# Patient Record
Sex: Female | Born: 1980 | Race: White | Hispanic: Yes | Marital: Married | Smoking: Current some day smoker
Health system: Southern US, Community
[De-identification: ages and names within clinical notes are randomized; demographics above are authoritative.]

---

## 2006-01-02 ENCOUNTER — Inpatient Hospital Stay (HOSPITAL_COMMUNITY): Admission: AD | Admit: 2006-01-02 | Discharge: 2006-01-02 | Payer: Self-pay | Admitting: Obstetrics and Gynecology

## 2006-02-08 ENCOUNTER — Inpatient Hospital Stay (HOSPITAL_COMMUNITY): Admission: AD | Admit: 2006-02-08 | Discharge: 2006-02-08 | Payer: Self-pay | Admitting: Obstetrics and Gynecology

## 2006-04-04 ENCOUNTER — Ambulatory Visit (HOSPITAL_COMMUNITY): Admission: RE | Admit: 2006-04-04 | Discharge: 2006-04-04 | Payer: Self-pay | Admitting: Gynecology

## 2006-05-02 ENCOUNTER — Ambulatory Visit (HOSPITAL_COMMUNITY): Admission: RE | Admit: 2006-05-02 | Discharge: 2006-05-02 | Payer: Self-pay | Admitting: Gynecology

## 2006-09-03 ENCOUNTER — Inpatient Hospital Stay: Admission: AD | Admit: 2006-09-03 | Discharge: 2006-09-03 | Payer: Self-pay | Admitting: Family Medicine

## 2006-09-03 ENCOUNTER — Inpatient Hospital Stay (HOSPITAL_COMMUNITY): Admission: AD | Admit: 2006-09-03 | Discharge: 2006-09-04 | Payer: Self-pay | Admitting: Obstetrics & Gynecology

## 2006-09-03 ENCOUNTER — Ambulatory Visit: Payer: Self-pay | Admitting: Obstetrics & Gynecology

## 2006-09-03 ENCOUNTER — Ambulatory Visit: Payer: Self-pay | Admitting: Family Medicine

## 2006-09-04 ENCOUNTER — Inpatient Hospital Stay (HOSPITAL_COMMUNITY): Admission: AD | Admit: 2006-09-04 | Discharge: 2006-09-06 | Payer: Self-pay | Admitting: Obstetrics & Gynecology

## 2006-09-04 ENCOUNTER — Ambulatory Visit: Payer: Self-pay | Admitting: *Deleted

## 2007-06-25 IMAGING — US US OB COMP LESS 14 WK
1 series · 14 of 28 positions shown · non-contrast
Comparison: None.

CLINICAL DATA: 4 weeks pregnant.  Vaginal discharge.  Lower abdominal pain.  Beta HCG level pending.
 OBSTETRICAL ULTRASOUND <14 WKS AND TRANSVAGINAL OB ULTRASOUND ? 01/02/06:
TECHNIQUE: Both transabdominal and transvaginal ultrasound examinations were performed for complete evaluation of the gestation as well as the maternal uterus, adnexal regions, and pelvic cul-de-sac.

[Series 1: us ob comp less 14 wk · 0.29mm/px · 14 of 35 slices shown]
[im 2/35]
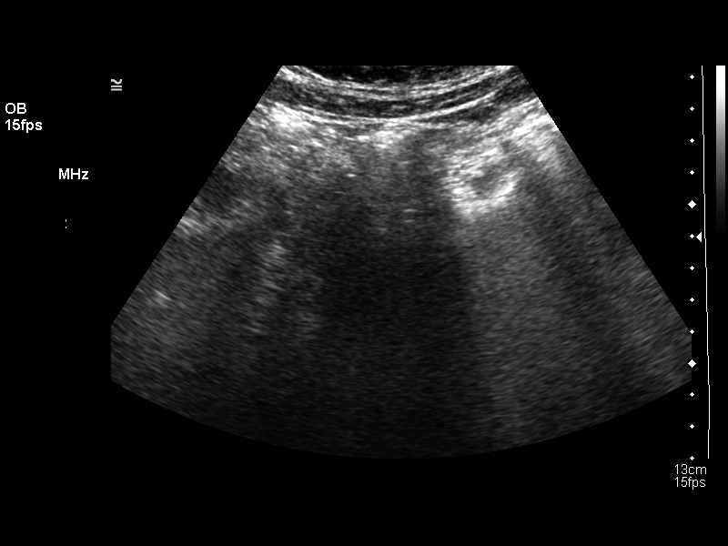
[im 4/35]
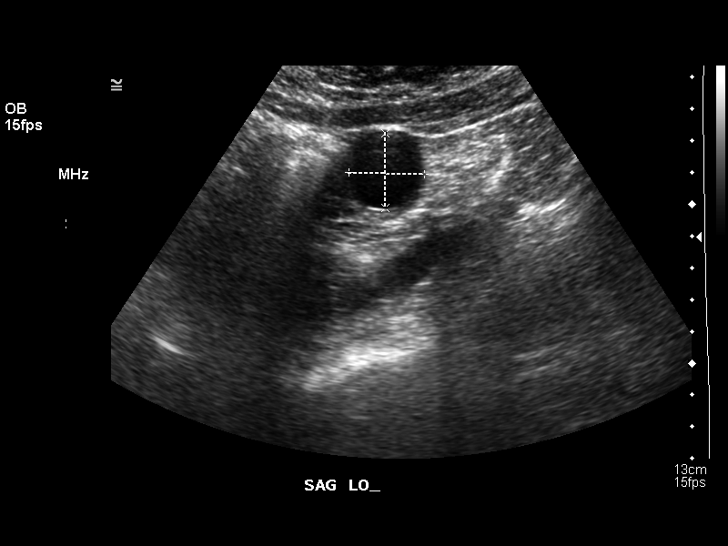
[im 7/35]
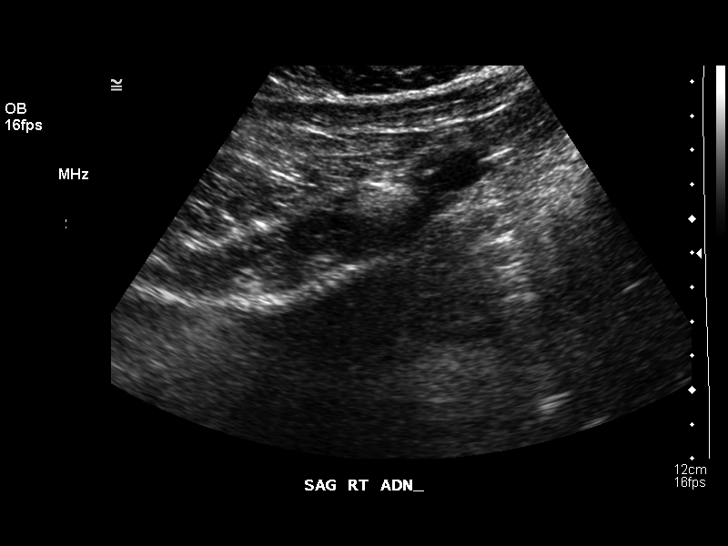
[im 9/35]
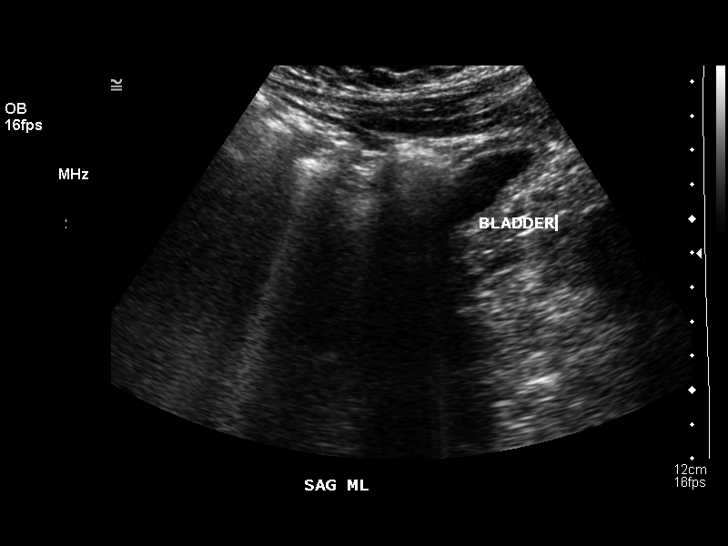
[im 12/35]
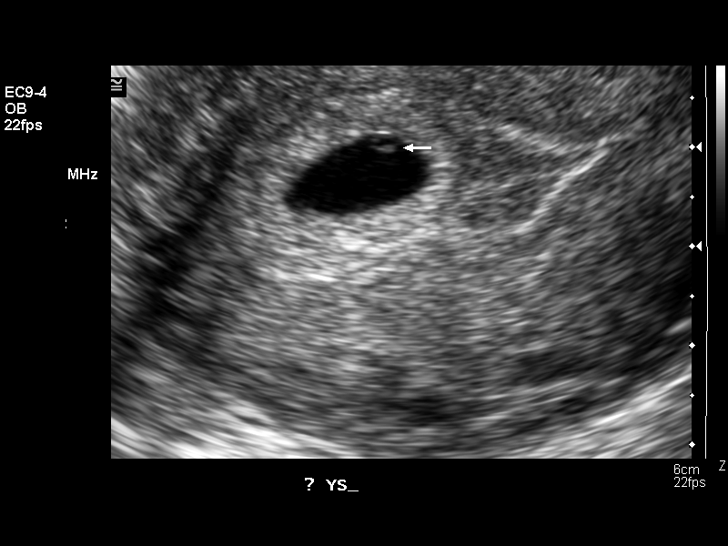
[im 14/35]
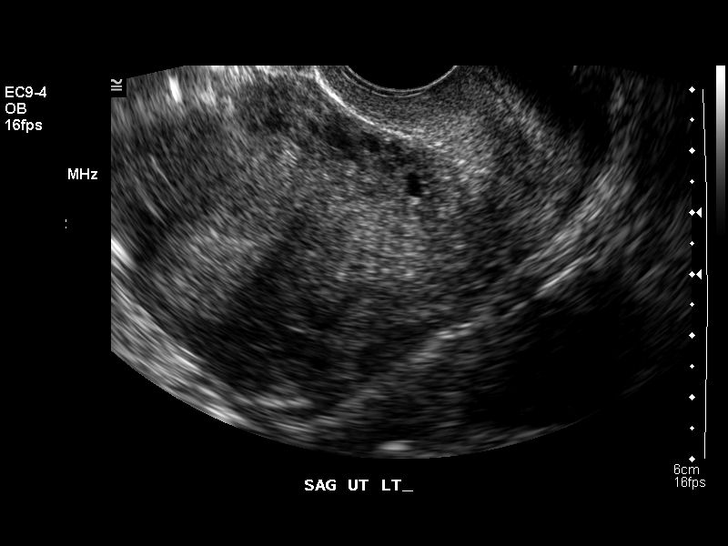
[im 17/35]
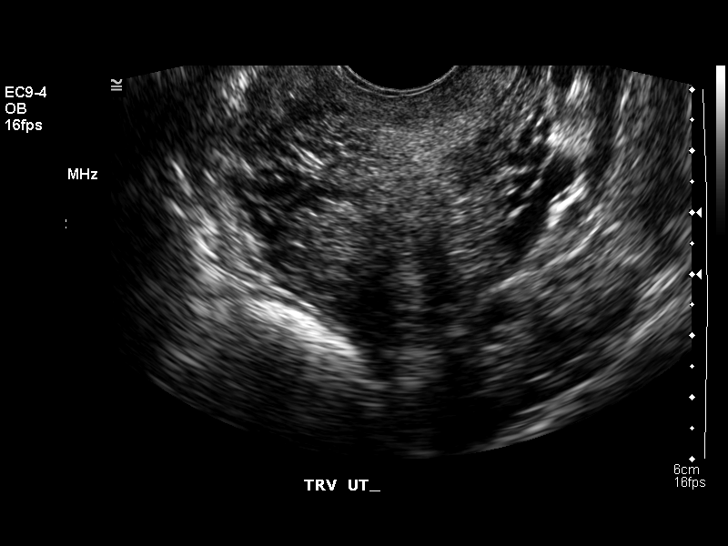
[im 19/35]
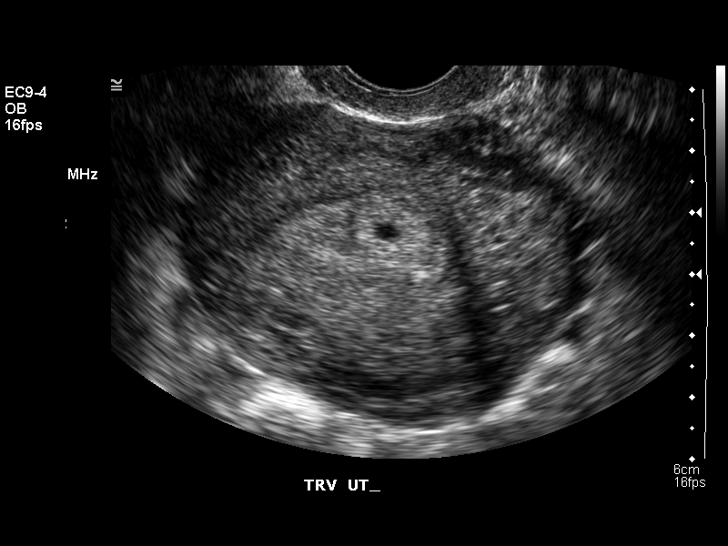
[im 22/35]
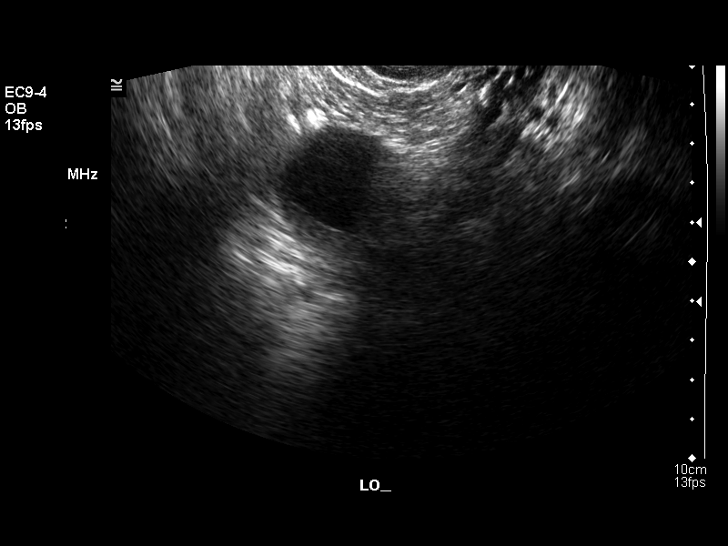
[im 24/35]
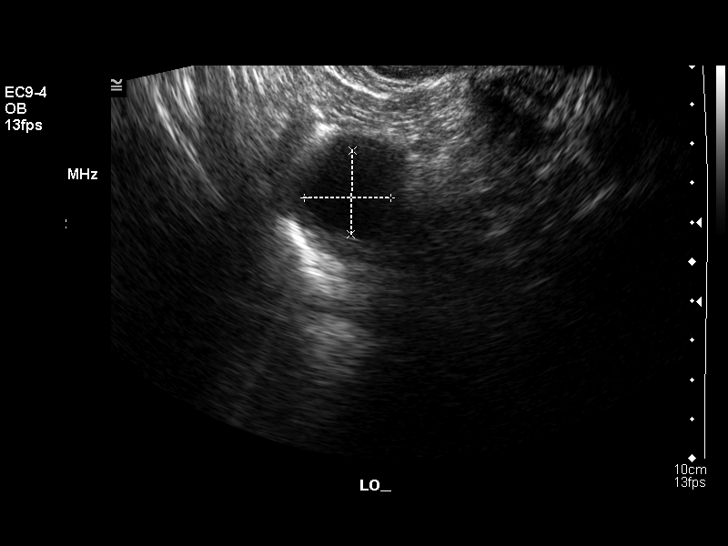
[im 27/35]
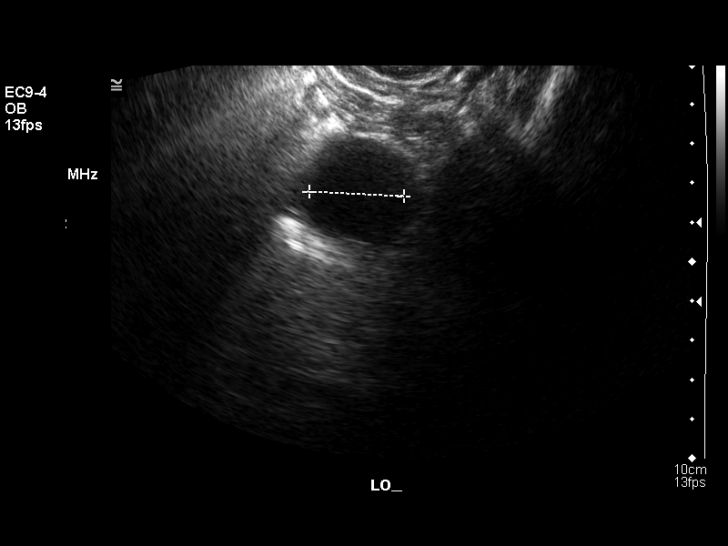
[im 29/35]
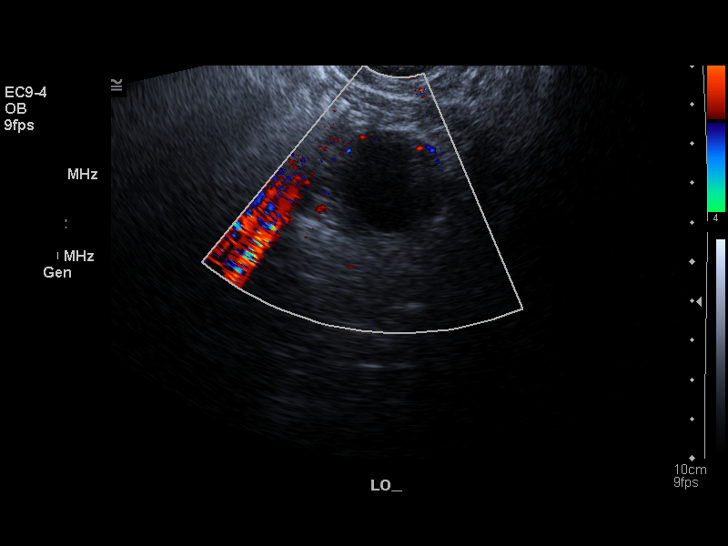
[im 32/35]
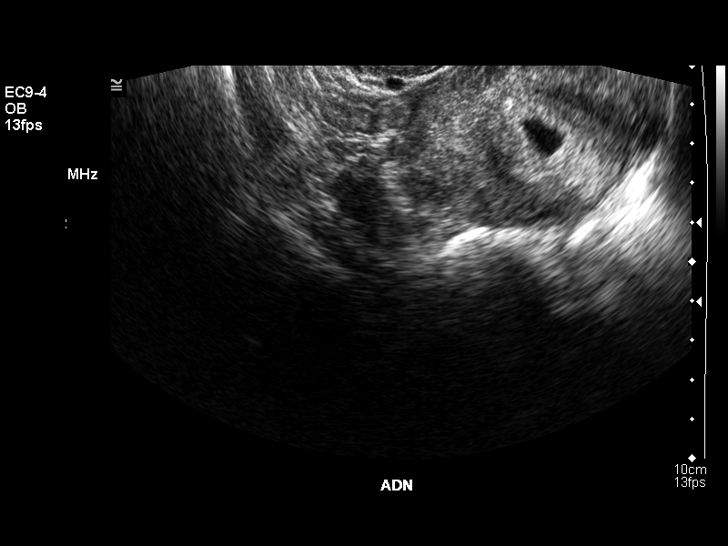
[im 35/35]
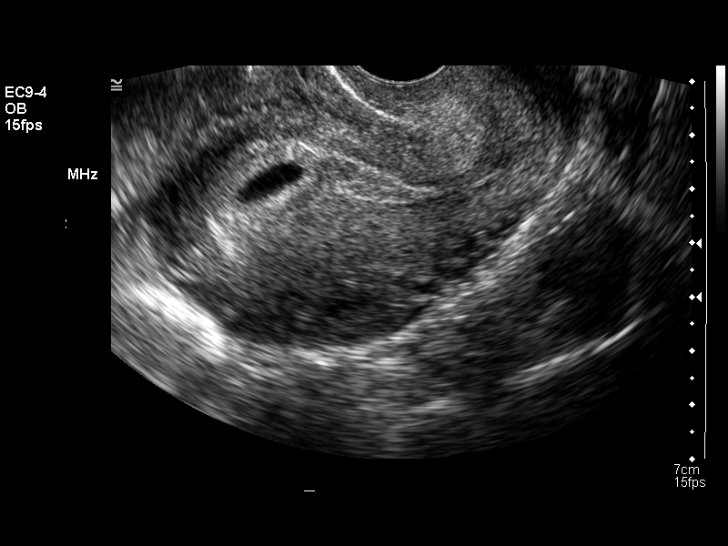

[14 of 28 positions shown; findings below may reference images not displayed]

FINDINGS: A single gestational sac is identified with a mean diameter of 10 mm corresponding to 5 weeks 5 days.  A yolk sac is identified.  An embryo is not identified.  There is no evidence of subchorionic hemorrhage.
 The right ovary is not well visualized.  The left ovary measures 3.5 x 2.1 x 3.1 cm and demonstrates a 2.3 cm cystic lesion within.  
 There is no significant free fluid.
IMPRESSION: 1.  Gestational sac corresponding to a gestational age of 5 weeks 5 days.  Small yolk sac but no fetal pole identified.  There is no evidence of subchorionic hemorrhage.  
 2.  Probable left ovarian corpus luteal cyst.

## 2007-09-25 IMAGING — US US OB COMP +14 WK
1 series · 13 of 28 positions shown · non-contrast
Comparison: none

CLINICAL DATA: Anatomic evaluation.

[Series 1: us ob comp +14 wk · 0.18mm/px · 13 of 128 slices shown]
[im 5/128]
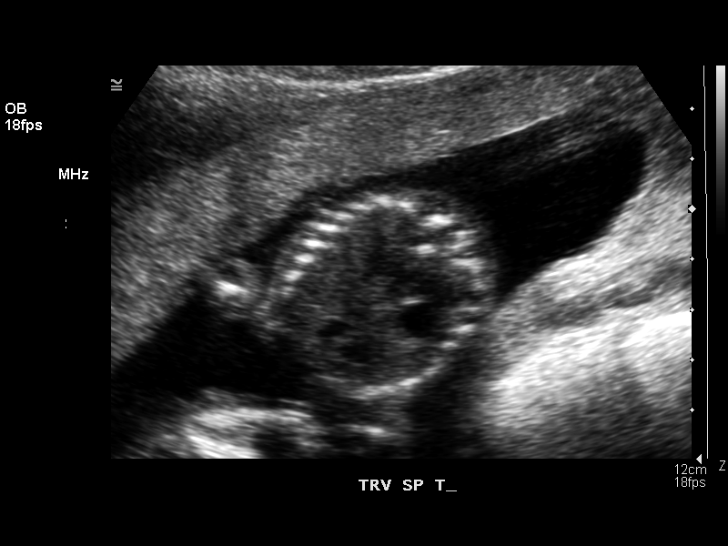
[im 15/128]
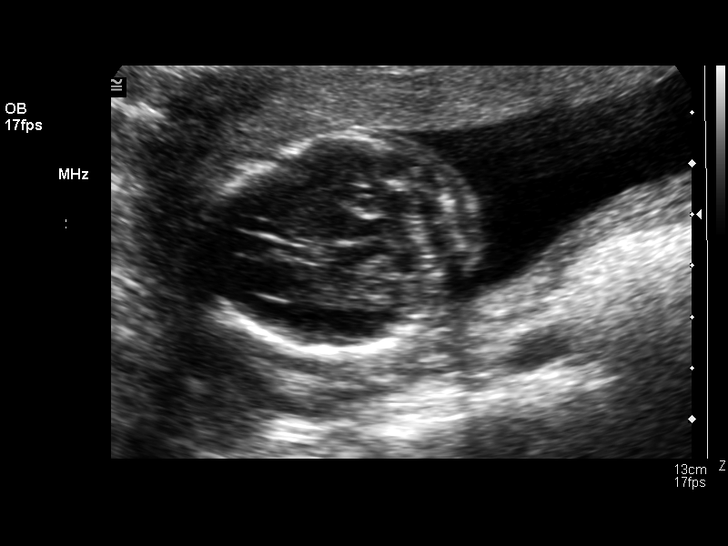
[im 24/128]
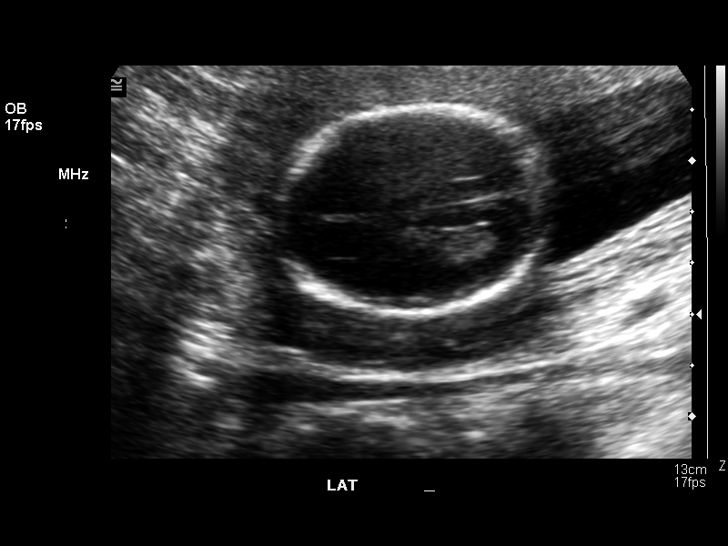
[im 33/128]
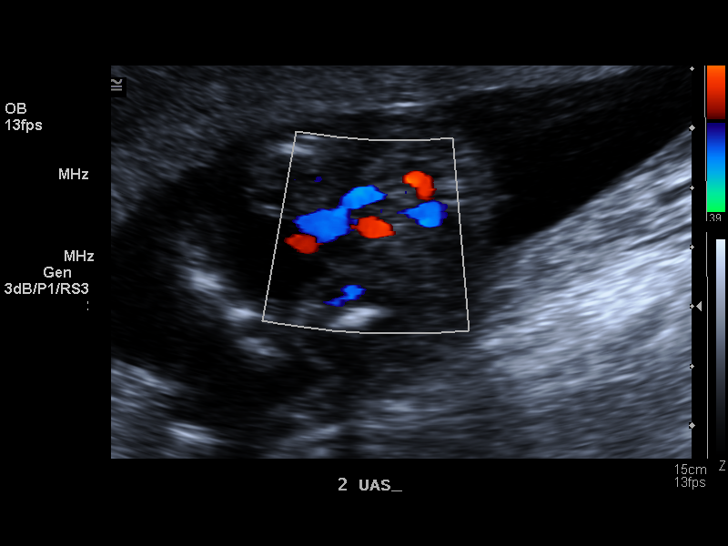
[im 43/128]
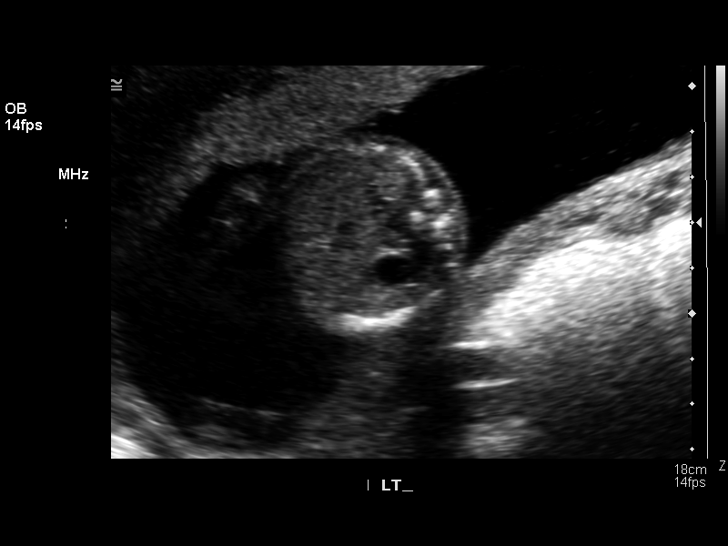
[im 52/128]
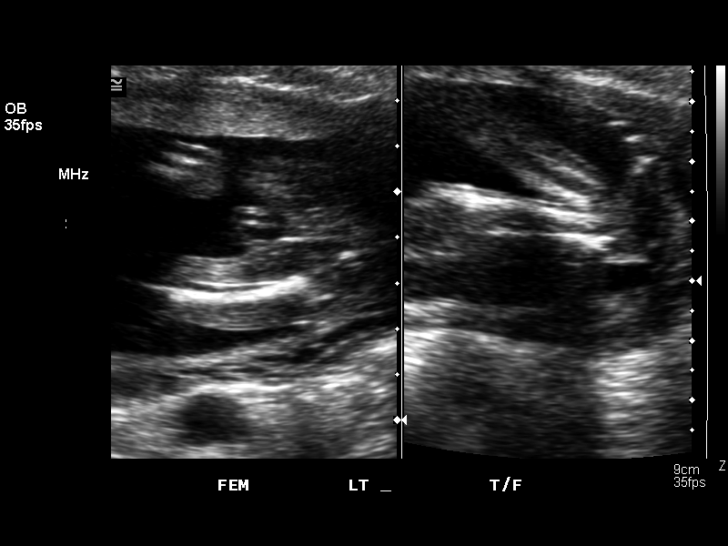
[im 66/128]
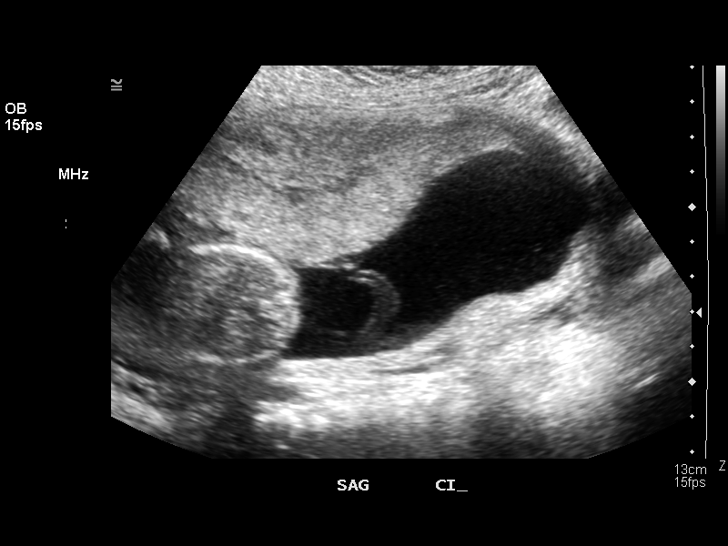
[im 76/128]
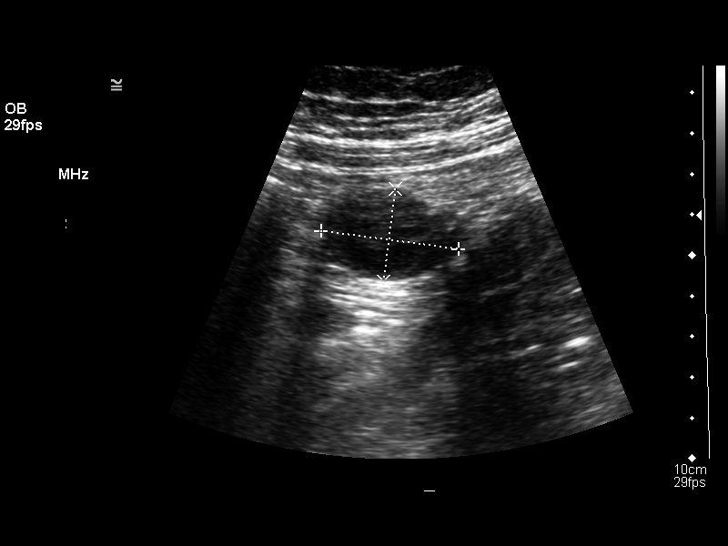
[im 85/128]
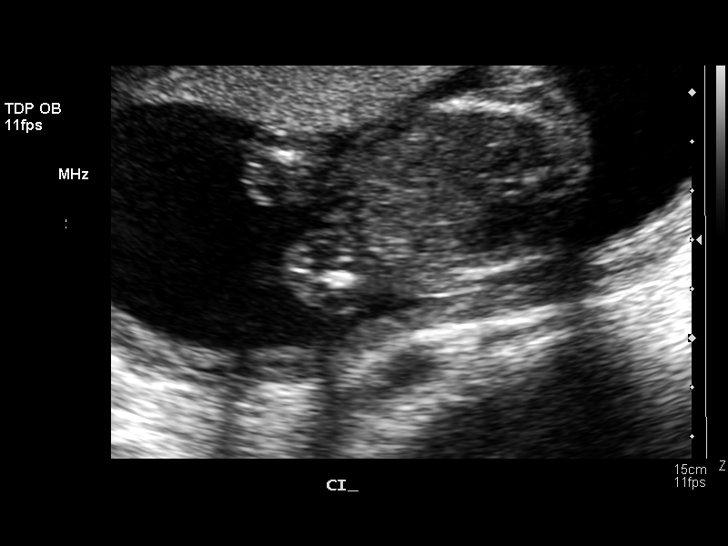
[im 95/128]
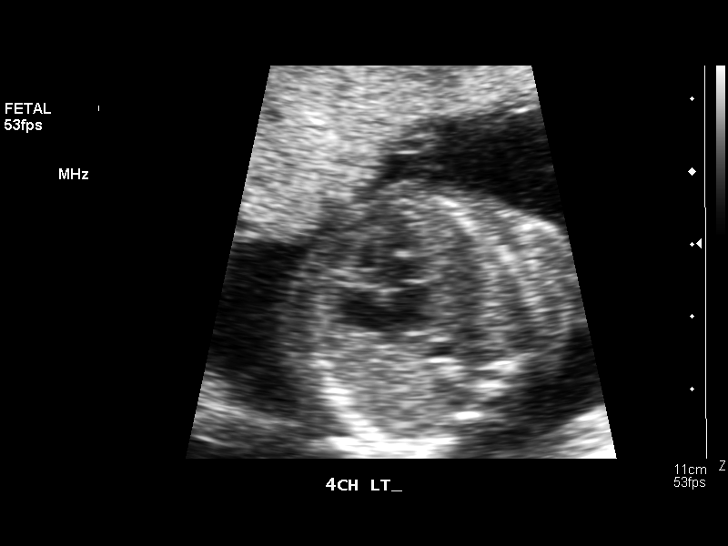
[im 104/128]
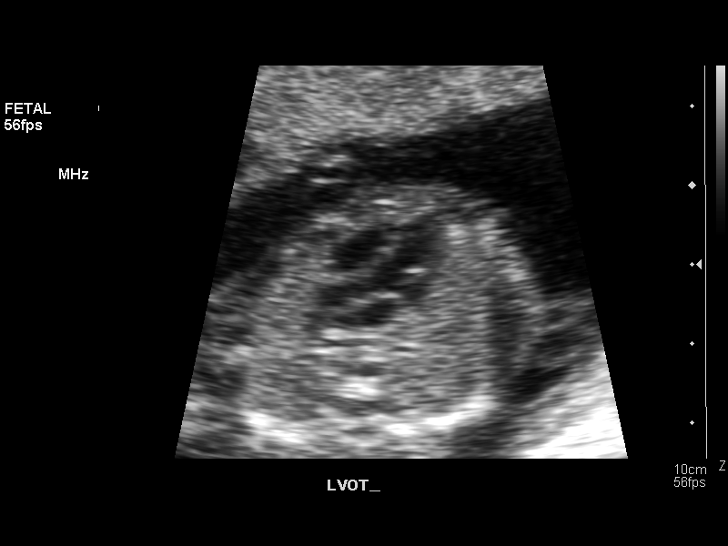
[im 113/128]
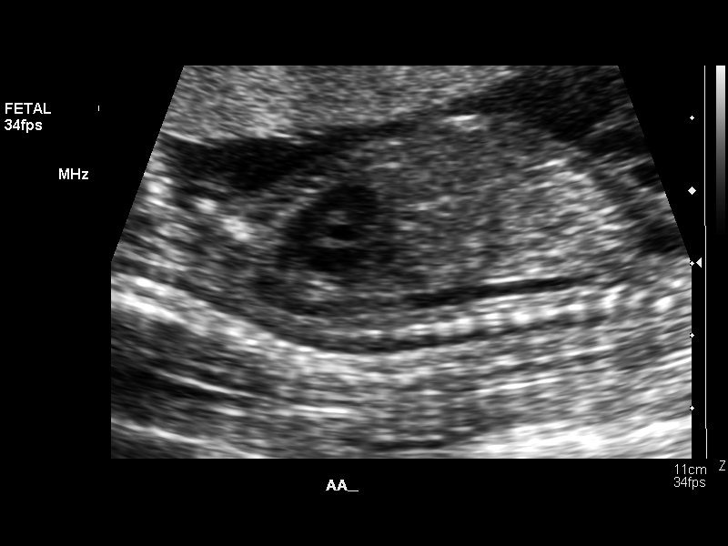
[im 123/128]
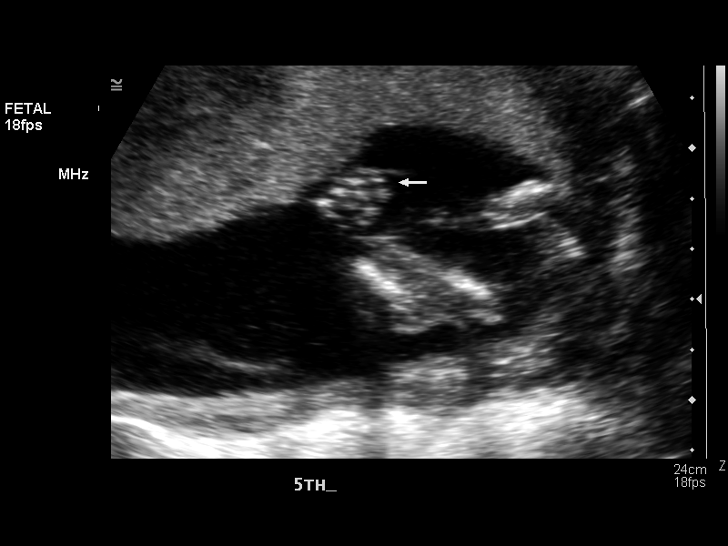

[13 of 28 positions shown; findings below may reference images not displayed]

OBSTETRICAL ULTRASOUND:
Number of Fetuses:  1
Heart Rate:  139 bpm
Movement:  Yes
Breathing:  No  
Presentation:  Transverse
Placental Location:  Anterior
Grade:  I
Previa:  No
Amniotic Fluid (Subjective):  Normal
Amniotic Fluid (Objective):   5.5 cm vertical pocket 

FETAL BIOMETRY
BPD:  4.1 cm   18 w 3 d 
HC:  15.4 cm   18 w 2 d 
AC:  13.5 cm   19 w 0 d 
FL:  3.0 cm   19 w 1 d
HL:  2.8 cm   19 w 1 d 

MEAN GA:  18 w 5 d   US EDC:  08/31/06

FETAL ANATOMY
Lateral Ventricles:  Visualized 
Thalami/CSP:  Visualized 
Posterior Fossa:  Visualized 
Nuchal Region:  NF= 3.8 mm  Visualized 
Spine:  Visualized 
4 Chamber Heart on Left:  Visualized 
Stomach on Left:  Visualized 
3 Vessel Cord:  Visualized 
Cord Insertion site:  Visualized 
Kidneys:  Visualized 
Bladder:  Visualized 
Extremities:  Visualized 

ADDITIONAL ANATOMY VISUALIZED:  LVOT, RVOT, upper lip, orbits, profile, diaphragm, heel, 5th digit, ductal arch, and aortic arch.

Noted is the presence of an echogenic intracardiac focus.

MATERNAL UTERINE AND ADNEXAL FINDINGS
Cervix:   3.6 cm transabdominal.

Noted is a simple left ovarian cyst measuring 2.2 x 2.2 x 2.2 cm.
IMPRESSION: 1.  Single intrauterine pregnancy demonstrating an estimated gestational age by ultrasound of 18 weeks 5 days.  Correlation with expected estimated gestational age by LMP of 18 weeks 6 days correlates with appropriate growth.  
2.  Noted is the presence of an isolated echogenic intracardiac focus within the left ventricle.  The remaining fetal anatomy was well visualized and has a normal appearance.  Specifically, no other soft markers for Down syndrome were noted.  Given the patient?s pre ultrasound odds-risk ratio for Down syndrome based on age alone of 1 in 928, today?s isolated finding would increase the patient?s odds-risk ratio for Down syndrome to [DATE].  Correlation with triple screen results would be recommended for a more complete assessment.  
3.  Simple left ovarian cyst is identified.  Given the current gestational age, this is felt unlikely to represent a corpus luteum cyst and most likely represents a persistent functional or benign neoplastic process.  Follow-up evaluation has been scheduled at the patient?s convenience in 1 month for short-term reassessment.  The patient?s scheduled appointment date is 05/02/06.

## 2016-05-05 LAB — GLUCOSE, POCT (MANUAL RESULT ENTRY): POC GLUCOSE: 78 mg/dL (ref 70–99)

## 2020-02-05 ENCOUNTER — Other Ambulatory Visit: Payer: Self-pay | Admitting: Obstetrics and Gynecology

## 2020-02-05 DIAGNOSIS — Z1231 Encounter for screening mammogram for malignant neoplasm of breast: Secondary | ICD-10-CM

## 2020-03-09 ENCOUNTER — Ambulatory Visit: Payer: Self-pay

## 2021-03-21 ENCOUNTER — Other Ambulatory Visit: Payer: Self-pay

## 2021-03-21 DIAGNOSIS — Z1231 Encounter for screening mammogram for malignant neoplasm of breast: Secondary | ICD-10-CM

## 2021-05-03 ENCOUNTER — Ambulatory Visit: Payer: Self-pay

## 2022-10-17 ENCOUNTER — Other Ambulatory Visit: Payer: Self-pay | Admitting: Obstetrics and Gynecology

## 2022-10-17 DIAGNOSIS — Z1231 Encounter for screening mammogram for malignant neoplasm of breast: Secondary | ICD-10-CM

## 2022-11-16 ENCOUNTER — Ambulatory Visit: Payer: Self-pay | Admitting: Hematology and Oncology

## 2022-11-16 ENCOUNTER — Ambulatory Visit
Admission: RE | Admit: 2022-11-16 | Discharge: 2022-11-16 | Disposition: A | Discharge status: Home / Self Care | Payer: No Typology Code available for payment source | Source: Ambulatory Visit | Attending: Obstetrics and Gynecology | Admitting: Obstetrics and Gynecology

## 2022-11-16 ENCOUNTER — Other Ambulatory Visit: Payer: Self-pay

## 2022-11-16 VITALS — BP 118/71 | Ht 61.0 in | Wt 127.0 lb

## 2022-11-16 DIAGNOSIS — Z1231 Encounter for screening mammogram for malignant neoplasm of breast: Secondary | ICD-10-CM

## 2022-11-16 MED ORDER — FLUCONAZOLE 150 MG PO TABS
150.0000 mg | ORAL_TABLET | Freq: Every day | ORAL | 2 refills | Status: DC
  Filled 2022-11-16: qty 2, 2d supply, fill #0
  Filled 2022-12-26: qty 2, 2d supply, fill #1

## 2022-11-16 NOTE — Progress Notes (Signed)
[name] is a 42 y.o. female who presents to [facility] clinic today with complaint of vaginal discharge not relieved by Metronidazole vaginal cream. Has area on left breast with lump with tenderness around menstrual cycles. Will send prescription to pharmacy for Diflucan.      Pap Smear: Pap not smear completed today. Last Pap smear was 2021 at [facility] clinic and was normal per patient. Will obtain records from [facility]. Per patient has no history of an abnormal Pap smear. Last Pap smear result is not available in Epic.   Physical exam: Breasts Breasts symmetrical. No skin abnormalities bilateral breasts. No nipple retraction bilateral breasts. No nipple discharge bilateral breasts. No lymphadenopathy. Round, mobile, non-tender mass noted on left breast. No lumps palpated right breast.         Pelvic/Bimanual Pap is not indicated today    Smoking History: Patient has reports smoking occasionally.  Not referred to quit line.    Patient Navigation: Patient education provided. Access to services provided for patient through BCCCP program. Lisa Jarvis interpreter provided. No transportation provided   Colorectal Cancer Screening: Per patient has never had colonoscopy completed. Not indicated at this time due to age. No complaints today.    Breast and Cervical Cancer Risk Assessment: Patient has no immediate family history of breast cancer, known genetic mutations, or radiation treatment to the chest before age 39. Reports she has maternal 2nd cousin with history of breast cancer. Patient does not have history of cervical dysplasia, immunocompromised, or DES exposure in-utero.  Risk Scores as of 11/16/2022     Lisa Jarvis           5-year 0.78 %   Lifetime 9.81 %   This patient is [place]/Lat[place] but has no documented birth country, so the [place] model used data from [place] patients to calculate their risk score. Document a birth country in the Demographics activity  for a more accurate score.         Last calculated by Lisa Jarvis, CMA on 11/16/2022 at  1:59 PM        A: BCCCP exam without pap smear Complains of breast tenderness during menstrual cycle and lump on left breast.   P: Referred patient to [facility] for a screening mammogram. Appointment scheduled 11/16/2022 at 230 pm.  Lisa Catching, RN 11/16/2022 2:19 PM

## 2022-11-17 ENCOUNTER — Other Ambulatory Visit: Payer: Self-pay

## 2022-11-22 ENCOUNTER — Other Ambulatory Visit: Payer: Self-pay

## 2022-12-19 NOTE — Progress Notes (Deleted)
Wisewoman initial screening   Interpreter- Natale Lay, [employer]   Clinical Measurement: There were no vitals filed for this visit. Fasting Labs Drawn Today, will review with patient when they result.   Medical History: Patient states that she {Blank single:19197::"has","does not have","does not know if she has"} high cholesterol, {Blank single:19197::"has","does not have","does not know if she has"} high blood pressure and she {Blank single:19197::"has","does not have","does not know if she has"} diabetes.  Medications: Patient states that she {Blank single:19197::"does","does not"} take medication to lower cholesterol, blood pressure or blood sugar.  Patient {Blank single:19197::"does","does not"} take an aspirin a day to help prevent a heart attack or stroke. During the past 7 days patient {Blank single:19197::"has","has not","N/A"} taken prescribed medication to lower {Blank single:19197::"cholesterol","blood pressure","blood sugar"} on *** days.   Blood pressure, self measurement: Patient states that she {Blank single:19197::"does","does not","does not have the equipment to"} measure blood pressure from home. She checks her blood pressure {Blank single:19197::"multiple times per day","daily","a few times per week","weekly","monthly","N/A"}. She shares her readings with a health care provider: {Blank single:19197::"yes","no","N/A"}.   Nutrition: Patient states that on average she eats *** cups of fruit and *** cups of vegetables per day. Patient states that she {Blank single:19197::"does","does not"} eat fish at least 2 times per week. Patient eats {Blank single:19197::"less than half","about half","more than half"} servings of whole grains. Patient drinks less than 36 ounces of beverages with added sugar weekly: {Blank single:19197::"yes","no"}. Patient is currently watching sodium or salt intake: {Blank single:19197::"yes","no"}. In the past 7 days patient has consumed drinks containing alcohol on  *** days. On a day that patient consumes drinks containing alcohol on average *** drinks are consumed.      Physical activity: Patient states that she gets *** minutes of moderate and *** minutes of vigorous physical activity each week.  Smoking status: Patient states that she has {Blank single:19197::"has never smoked","is a former smoker, quit 1-12 months ago","is a former smoker, quit 12+ months ago","is a current smoker"} .   Quality of life: Over the past 2 weeks patient states that she had little interest or pleasure in doing things: {Blank single:19197::"not at all","several days","more than half","nearly everyday"}. She has been feeling down, depressed or hopeless:{Blank single:19197::"not at all","several days","more than half","nearly everyday"}.   Social Determinants of Health Assessment:   Computer Use: During the last 12 months patient states that she has used any of the following: desktop/laptop, smart phone or tablet/other portable wireless computer: {Blank single:19197::"yes","no","don't know"}.   Internet Use: During the last 12 months, did you or any member of your household have access to the internet: {Blank single:19197::"Yes, by paying a cell phone company or internet service provider","Yes, without paying a cell phone company or internet service provider","No access to internet in this house,apartment or mobile home"}.   Food Insecurities: During the last 12 months, where there any times when you were worried that you would run out of food because of a lack of money or other resources: {Blank single:19197::"Yes","No"}.   Transportation Barriers: During the last 12 months, have you missed a doctor's appointment because of transportation problems: {Blank single:19197::"Yes","No"}.   Childcare Barriers: If you are currently using childcare services, please identify  the type of services you use. (If not using childcare services, please select "Not applicable"): {Blank  single:19197::"infant (birth to 22 months","toddler (11 to 36 months)","preschool (3 to 5 years)","after school care (K-9th grade)","not applicable"}. During the last 12 months, have you had any barriers to childcare services such as: {Blank single:19197::"cost","availability","location","transportation","hours of operation","other","not applicable"}.  Housing: What is your housing situation today: {Blank single:19197::"I have housing","I have housing, but I am worried about losing my housing","I do not have housing"}.   Intimate Partner Violence: During the last 12 months, how often did your partner physically hurt you: {Blank single:19197::"never","rarely","sometimes","fairly often","frequently","don't want to answer"}. During the last 12 months, how often did your partner insult you or talk down to you: {Blank single:19197::"never","rarely","sometimes","fairly often","frequently","don't want to answer"}.  Medication Adherence: During the last 12 months, did you ever forget to take your medicine: {Blank single:19197::"Yes","No","not applicable"}. During the last 12 months, were you careless ar times about taking your medicine: {Blank single:19197::"Yes","No","not applicable"}. During the last 12 months, when you felt better did you sometimes stop taking your medication: {Blank single:19197::"Yes","No","not applicable"}. During the last 12 months, sometimes if you felt worse when you took your medicine did you stop taking it: {Blank single:19197::"Yes","No","not applicable"}.   Risk reduction and counseling:   Health Coaching:   Goal:    Navigation:  I will notify patient of lab results.  Patient is aware of 2 more health coaching sessions and a follow up.  Time: 25 minutes

## 2022-12-20 ENCOUNTER — Inpatient Hospital Stay: Payer: Self-pay

## 2022-12-20 ENCOUNTER — Other Ambulatory Visit: Payer: Self-pay

## 2022-12-20 DIAGNOSIS — Z0000 Encounter for general adult medical examination without abnormal findings: Secondary | ICD-10-CM

## 2022-12-26 ENCOUNTER — Other Ambulatory Visit: Payer: Self-pay

## 2022-12-27 ENCOUNTER — Other Ambulatory Visit: Payer: Self-pay

## 2022-12-27 ENCOUNTER — Inpatient Hospital Stay: Payer: No Typology Code available for payment source | Attending: Obstetrics and Gynecology | Admitting: *Deleted

## 2022-12-27 VITALS — BP 100/60 | Ht 61.0 in | Wt 128.8 lb

## 2022-12-27 DIAGNOSIS — Z0000 Encounter for general adult medical examination without abnormal findings: Secondary | ICD-10-CM

## 2022-12-27 NOTE — Progress Notes (Signed)
Wisewoman initial screening   Interpreter- Natale Lay, [employer]   Clinical Measurement: There were no vitals filed for this visit. Fasting Labs Drawn Today, will review with patient when they result.   Medical History: Patient states that she has high cholesterol, does not have high blood pressure and she does not have diabetes.  Medications: Patient states that she does not take medication to lower cholesterol, blood pressure or blood sugar.  Patient does not take an aspirin a day to help prevent a heart attack or stroke.    Blood pressure, self measurement: Patient states that she does not measure blood pressure from home. She checks her blood pressure N/A. She shares her readings with a health care provider: N/A.   Nutrition: Patient states that on average she eats 0 cups of fruit and 2 cups of vegetables per day. Patient states that she does not eat fish at least 2 times per week. Patient eats more than half servings of whole grains. Patient drinks less than 36 ounces of beverages with added sugar weekly: yes. Patient is currently watching sodium or salt intake: no. In the past 7 days patient has consumed drinks containing alcohol on 0 days. On a day that patient consumes drinks containing alcohol on average 3 drinks are consumed.      Physical activity: Patient states that she gets 0 minutes of moderate and 0 minutes of vigorous physical activity each week.  Smoking status: Patient states that she has is a current smoker .   Quality of life: Over the past 2 weeks patient states that she had little interest or pleasure in doing things: not at all. She has been feeling down, depressed or hopeless:not at all.   Social Determinants of Health Assessment:   Computer Use: During the last 12 months patient states that she has used any of the following: desktop/laptop, smart phone or tablet/other portable wireless computer: yes.   Internet Use: During the last 12 months, did you or any member  of your household have access to the internet: Yes, by paying a cell phone company or internet service provider.   Food Insecurities: During the last 12 months, where there any times when you were worried that you would run out of food because of a lack of money or other resources: No.   Transportation Barriers: During the last 12 months, have you missed a doctor's appointment because of transportation problems: No.   Childcare Barriers: If you are currently using childcare services, please identify  the type of services you use. (If not using childcare services, please select "Not applicable"): not applicable. During the last 12 months, have you had any barriers to childcare services such as: not applicable.   Housing: What is your housing situation today: I have housing.   Intimate Partner Violence: During the last 12 months, how often did your partner physically hurt you: never. During the last 12 months, how often did your partner insult you or talk down to you: never.  Medication Adherence: During the last 12 months, did you ever forget to take your medicine: not applicable. During the last 12 months, were you careless ar times about taking your medicine: not applicable. During the last 12 months, when you felt better did you sometimes stop taking your medication: not applicable. During the last 12 months, sometimes if you felt worse when you took your medicine did you stop taking it: not applicable.   Risk reduction and counseling:   Health Coaching: Spoke with patient about  increasing the amount of heart healthy fish that she consumes. Patient consumes a serving of fish weekly. Gave suggestions for salmon, tuna, mackerel, sardines, sea bass or trout. Encouraged patient to try and start adding fruits into her daily diet. Patient consumes at least 2 servings of vegetables per day. Patient consumes a variety of whole grains ( brown rice, oatmeal, whole wheat pasta and whole wheat bread).  Encouraged patient to start being more mindful of salt intake. Patient used to run in the past but is currently not exercising. [Patient would like to start back with exercising and has set an exercise based goal (see below).  Goal: Patient would like to start exercising. Patient will start with two days of exercise for 20-30 minutes. Patient will work on reaching this goal over the next month. Once this goal is reached patient will increase the days per week of exercising.    Navigation:  I will notify patient of lab results.  Patient is aware of 2 more health coaching sessions and a follow up.  Time: 25 minutes

## 2022-12-28 ENCOUNTER — Other Ambulatory Visit: Payer: Self-pay

## 2023-01-08 ENCOUNTER — Telehealth: Payer: Self-pay

## 2023-01-08 NOTE — Telephone Encounter (Signed)
Health coaching 2   interpreter- Natale Lay, [employer]   Labs- 188 cholesterol, 105 LDL cholesterol, 74 triglycerides, 69 HDL cholesterol, 5.7 hemoglobin A1C, 81 mean plasma glucose. Patient understands and is aware of her lab results.   Goals-  1. Reduce the amount of fried and fatty foods consumed. Try to grill, bake, broil or sautee foods instead. 2. Reduce the amount of red meat consumed. Substitute for lean proteins like chicken or fish. 3. Reduce the amount of whole fat dairy products consumed. 4. Increase whole grain foods intake. 5. Decrease the amount of sweet and sugary food and drinks consumed. 6. Decrease the amount of carb rich foods consumed. 7. Start exercising for 20-30 minutes daily.   Navigation:  Patient is aware of 1 more health coaching sessions and a follow up. Referred patient to Internal Medicine for follow-up for elevated labs.   Time- 15 minutes

## 2023-01-31 ENCOUNTER — Ambulatory Visit (INDEPENDENT_AMBULATORY_CARE_PROVIDER_SITE_OTHER): Payer: Self-pay | Admitting: Internal Medicine

## 2023-01-31 ENCOUNTER — Encounter: Payer: Self-pay | Admitting: Internal Medicine

## 2023-01-31 ENCOUNTER — Other Ambulatory Visit: Payer: Self-pay

## 2023-01-31 VITALS — BP 101/58 | HR 71 | Temp 98.5°F | Resp 28 | Ht 62.0 in | Wt 127.5 lb

## 2023-01-31 DIAGNOSIS — R7303 Prediabetes: Secondary | ICD-10-CM

## 2023-01-31 DIAGNOSIS — Z0000 Encounter for general adult medical examination without abnormal findings: Secondary | ICD-10-CM

## 2023-01-31 DIAGNOSIS — N898 Other specified noninflammatory disorders of vagina: Secondary | ICD-10-CM

## 2023-01-31 DIAGNOSIS — E785 Hyperlipidemia, unspecified: Secondary | ICD-10-CM

## 2023-01-31 NOTE — Patient Instructions (Addendum)
Lisa Jarvis, it was a pleasure seeing you today! You endorsed feeling well today. Below are some of the things we talked about this visit. We look forward to seeing you in the follow up appointment!  Today we discussed: You are doing well. Please exercise 150 minutes per week at a minimum and eat more fruits and vegetables. Remove all fast foods from your diet.   I have ordered the following labs today:  Lab Orders  No laboratory test(s) ordered today      Referrals ordered today:   Referral Orders  No referral(s) requested today     I have ordered the following medication/changed the following medications:   Stop the following medications: There are no discontinued medications.   Start the following medications: No orders of the defined types were placed in this encounter.    Follow-up: schedule follow up once you filled out the paper work   Please make sure to arrive 15 minutes prior to your next appointment. If you arrive late, you may be asked to reschedule.   We look forward to seeing you next time. Please call our clinic at [phone] if you have any questions or concerns. The best time to call is Monday-Friday from 9am-4pm, but there is someone available 24/7. If after hours or the weekend, call the main hospital number and ask for the Internal Medicine Resident On-Call. If you need medication refills, please notify your pharmacy one week in advance and they will send [place] a request.  Thank you for letting us take part in your care. Wishing you the best!  Thank you, Gwenevere Abbot, MD  Lisa Jarvis, fue un placer verla hoy! Respaldaste sentirte bien hoy. A continuacin se presentan algunas de las cosas que hablamos sobre esta visita. Esperamos verlo en la cita de seguimiento!  Hoy discutimos: Lo ests [employer]. Haga ejercicio 150 minutos por semana como mnimo y coma ms frutas y verduras. Elimina todas las comidas rpidas de tu dieta.   He  ordenado los siguientes laboratorios hoy:  rdenes de laboratorio No se han solicitado pruebas de laboratorio hoy     Referencias ordenadas hoy:   rdenes de referencia No se solicitaron referencias hoy    He ordenado/cambiado los siguientes medicamentos:   Suspenda los siguientes medicamentos: No hay medicamentos discontinuados.   Inicie los siguientes medicamentos: En este encuentro no se realizaron rdenes de los tipos definidos.    Seguimiento: programe el seguimiento una vez que haya completado el papeleo.   Asegrese de llegar 15 minutos antes de su prxima cita. Si llega tarde, es posible que le soliciten reprogramar su cita.   Esperamos verte la prxima vez. Llame a nuestra clnica al [phone] si tiene alguna pregunta o inquietud. El mejor momento para llamar es de lunes a viernes de 9 a. m. a 4 p. m., pero hay alguien disponible las 24 horas, los 7 [address]  [address] de la [place]. Si es fuera del horario de atencin o durante el fin de [place], llame al nmero principal del hospital y pregunte por el residente de guardia de medicina interna. Si necesita reabastecimiento de medicamentos, notifique a su farmacia con una semana de anticipacin y ellos nos enviarn una solicitud.  Gracias por permitirnos participar en su cuidado. Te deseo lo mejor!  Gelene Mink, MD

## 2023-01-31 NOTE — Progress Notes (Unsigned)
   CC: Wellness visit/Establishment of Care  HPI:  Ms.Lisa Jarvis is a 42 y.o. without any medical history presenting to [facility] for a wellness visit.   Please see problem-based list for further details, assessments, and plans.  No past medical history on file.       Current Outpatient Medications (Other):    fluconazole (DIFLUCAN) 150 MG tablet, Take 1 tablet (150 mg total) by mouth daily. (Patient not taking: Reported on 12/27/2022)   Multiple Vitamin (MULTIVITAMIN) capsule, Take 1 capsule by mouth daily. (Patient not taking: Reported on 12/27/2022)   Probiotic Product (PROBIOTIC PO), Take by mouth.  Family Hx:  DMII: Maternal GM HTN: Mother, Maternal GM Cancer: No family hx  Surgical Hx: No surgeries.   Allergies: NKDA  Social Hx: Lives with boyfriend and 81 year old daughter. Smoked with she was 14 for 1 ppd until 19 and then quit. Smoking only 3 cigarettes per week. Works at [employer].    Review of Systems:  Review of system negative unless stated in the problem list or HPI.    Physical Exam:  Vitals:   01/31/23 1100  BP: (!) 101/58  Pulse: 71  Resp: (!) 28  Temp: 98.5 F (36.9 C)  TempSrc: Oral  SpO2: 100%  Weight: 127 lb 8 oz (57.8 kg)  Height: 5\' 2"  (1.575 m)   Physical Exam General: NAD HENT: NCAT Lungs: CTAB, no wheeze, rhonchi or rales.  Cardiovascular: Normal heart sounds, no r/m/g, 2+ pulses in all extremities. No LE edema Abdomen: No TTP, normal bowel sounds MSK: No asymmetry or muscle atrophy.  Skin: no lesions noted on exposed skin Neuro: Alert and oriented x4. CN grossly intact Psych: Normal mood and normal affect   Assessment & Plan:   No problem-specific Assessment & Plan notes found for this encounter.   See Encounters Tab for problem based charting.  Patient Discussed with Dr. {NAMES:[id number]::"Guilloud","Hoffman","Mullen","Narendra","Vincent","Machen","Lau","Hatcher","Williams"} Gwenevere Abbot, MD Eligha Bridegroom. [facility] Internal Medicine Residency, PGY-3   ASCVD risk 0.3 % Pap testing due tin 2026. Negative in 01/2020  HM   Candida Infection Diflucan x4 doses Discharge that is creamy, itching at times. Resolved with metronidazole but recurred. Not helped  IUD unsure but for 10 years. 4 years.

## 2023-02-01 DIAGNOSIS — E785 Hyperlipidemia, unspecified: Secondary | ICD-10-CM | POA: Insufficient documentation

## 2023-02-01 DIAGNOSIS — R7303 Prediabetes: Secondary | ICD-10-CM | POA: Insufficient documentation

## 2023-02-01 DIAGNOSIS — N898 Other specified noninflammatory disorders of vagina: Secondary | ICD-10-CM | POA: Insufficient documentation

## 2023-02-01 DIAGNOSIS — Z0000 Encounter for general adult medical examination without abnormal findings: Secondary | ICD-10-CM | POA: Insufficient documentation

## 2023-02-01 NOTE — Addendum Note (Signed)
Addended by: Dickie La on: 02/01/2023 01:54 PM   Modules accepted: Level of Service

## 2023-02-01 NOTE — Assessment & Plan Note (Signed)
Pt referred here for elevated A1c and elevated cholesterol. Her LDL is 105 which is mildly elevated. Given lack of diabetes and HTN her ASCVD score is <0.5 %. Advised pt to use lifestyle modifications to decrease her LDL (see prediabetes).

## 2023-02-01 NOTE — Assessment & Plan Note (Signed)
Pt with A1c of 5.7 consistent with prediabetes. Encouraged life style modifications including 150 minutes of moderate intensity exercise and healthy eating. Hand out provided on food to eat. Advised to limit eating fast foods.

## 2023-02-01 NOTE — Progress Notes (Signed)
[facility] Medicine Clinic Attending  Case discussed with the resident at the time of the visit.  We reviewed the resident's history and exam and pertinent patient test results.  I agree with the assessment, diagnosis, and plan of care documented in the resident's note.  

## 2023-02-01 NOTE — Assessment & Plan Note (Addendum)
Pt has orange card paper work and states she will fill this out ASAP. Can perform routine preventive screenings once this is filled out. Patient UTD on pap smear. It was negative with co-testing on 01/2020 so recommend repeating in 5 years. Pt has IUD in place that was placed 4 years ago. Suspect it is copper IUD as she states it needs to be removed every 10 years.

## 2023-02-01 NOTE — Assessment & Plan Note (Signed)
Pt reporting a chronic problem with vaginal discharge. She states the discharge that is creamy. Reports intermittent symptoms but none currently. States it itches at times. She was given Diflucan on two occasions but this did not help. She reports it resolved with metronidazole but has recurred. She has been told this could be physiologic by the [facility]. Advised if she is having symptoms such as vulvovaginal pain or itching, then we can test the discharge. Will monitor at follow up.

## 2023-04-16 ENCOUNTER — Other Ambulatory Visit: Payer: Self-pay

## 2023-09-28 ENCOUNTER — Telehealth: Payer: Self-pay

## 2023-09-28 NOTE — Telephone Encounter (Signed)
 [facility] 3  interpreter- Herma Longest, [facility]  Navigation:  Patient is aware of  a follow up session. Patient is scheduled for FU on 11/05/23.

## 2023-11-02 NOTE — Progress Notes (Deleted)
 [facility] follow up   Interpreter: Natale Lay, [employer]  Clinical Measurement:   Vitals:   06/18/23 1034 06/18/23 1048  BP: 128/80 130/76      Medical History: Patient states that she  does not know if she has  high cholesterol, does not have high blood pressure and she does not have diabetes.    Medications: Patient states that she does not take medication to lower cholesterol, blood pressure and blood sugar.  Patient does not take an aspirin a day to help prevent a heart attack or stroke.    Blood pressure, self measurement: Patient states that she does measure blood pressure from home. She checks her blood pressure weekly. She shares her readings with a health care provider: no.   Nutrition: Patient states that on average she eats 2 cups of fruit and 3 cups of vegetables per day. Patient states that she does eat fish at least 2 times per week. Patient eats less than half servings of whole grains. Patient drinks less than 36 ounces of beverages with added sugar weekly: yes. Patient is currently watching sodium or salt intake: yes. In the past 7 days patient has had 0 drinks containing alcohol. On average patient drinks 0 drinks containing alcohol per day.      Physical activity: Patient states that she gets 90 minutes of moderate and 0 minutes of vigorous physical activity each week.  Smoking status: Patient states that she has has never smoked .   Quality of life: Over the past 2 weeks patient states that she had little interest or pleasure in doing things: not at all. She has been feeling down, depressed or hopeless:not at all.   Social Determinants of Health Assessment:   Computer Use: During the last 12 months patient states that she has used any of the following: desktop/laptop, smart phone or tablet/other portable wireless computer: yes.   Internet Use: During the last 12 months, did you or any member of your household have access to the internet: Yes, by paying a cell phone  company or internet service provider.   Food Insecurities: During the last 12 months, where there any times when you were worried that you would run out of food because of a lack of money or other resources: No.   Transportation Barriers: During the last 12 months, have you missed a doctor's appointment because of transportation problems: No.   Childcare Barriers: If you are currently using childcare services, please identify  the type of services you use. (If not using childcare services, please select "Not applicable"): not applicable. During the last 12 months, have you had any barriers to childcare services such as: not applicable.   Housing: What is your housing situation today: I have housing.   Intimate Partner Violence: During the last 12 months, how often did your partner physically hurt you: never. During the last 12 months, how often did your partner insult you or talk down to you: never.  Medication Adherence: During the last 12 months, did you ever forget to take your medicine: not applicable. During the last 12 months, were you careless ar times about taking your medicine: not applicable. During the last 12 months, when you felt better did you sometimes stop taking your medication: not applicable. During the last 12 months, sometimes if you felt worse when you took your medicine did you stop taking it: not applicable.    Risk reduction and counseling:   Patient has been making green smoothies daily. Patient puts a  variety of fruits and vegetables in her smoothies (spinach, cucumber, lemon and pineapple). Patient states that she also adds chia seeds to her smoothies. Patient also adds chia seeds and flax seeds to smoothies. Patient has been eating heart healthy fish twice a week. Gave recommendations for whole grains that she can try adding into her diet. Patient has not been consuming sodas. Patient watches the amount of salt that she uses when cooking. Patient has been walking three  days a week for 30 minutes. Patient would like to increase exercise to five days per week. Patient is also still working on cutting back on the amount of tortillas that she consumes daily. Patient is currently consuming four tortillas per day.   Navigation: This was the  follow up session for this patient, I will check up on her progress in the coming months.  Time: 25 minutes

## 2023-11-05 ENCOUNTER — Ambulatory Visit: Payer: Self-pay

## 2023-11-05 DIAGNOSIS — Z0000 Encounter for general adult medical examination without abnormal findings: Secondary | ICD-10-CM
